# Patient Record
Sex: Male | Born: 1980 | Race: White | Hispanic: No | Marital: Single
Health system: Southern US, Community
[De-identification: ages and names within clinical notes are randomized; demographics above are authoritative.]

---

## 2005-02-13 ENCOUNTER — Encounter: Admission: RE | Admit: 2005-02-13 | Discharge: 2005-02-13 | Payer: Self-pay | Admitting: Neurosurgery

## 2005-02-26 ENCOUNTER — Encounter: Admission: RE | Admit: 2005-02-26 | Discharge: 2005-02-26 | Payer: Self-pay | Admitting: Neurosurgery

## 2005-09-18 ENCOUNTER — Emergency Department: Payer: Self-pay | Admitting: Emergency Medicine

## 2007-07-18 ENCOUNTER — Emergency Department: Payer: Self-pay | Admitting: Emergency Medicine

## 2007-09-07 ENCOUNTER — Emergency Department: Payer: Self-pay | Admitting: Emergency Medicine

## 2008-02-03 ENCOUNTER — Other Ambulatory Visit: Payer: Self-pay | Admitting: Emergency Medicine

## 2008-02-03 ENCOUNTER — Other Ambulatory Visit: Payer: Self-pay

## 2008-02-04 ENCOUNTER — Inpatient Hospital Stay (HOSPITAL_COMMUNITY): Admission: RE | Admit: 2008-02-04 | Discharge: 2008-02-09 | Payer: Self-pay | Admitting: Psychiatry

## 2008-02-10 ENCOUNTER — Ambulatory Visit: Payer: Self-pay | Admitting: Psychiatry

## 2008-08-28 IMAGING — CR DG FOOT COMPLETE 3+V*L*
1 series · 3 of 3 positions shown · non-contrast
Comparison: none

REASON FOR EXAM: Fall, swelling over dorsum
COMMENTS:

[Series 1: view not recorded · 0.17mm/px · 3 of 3 slices shown]
[im 1/3]
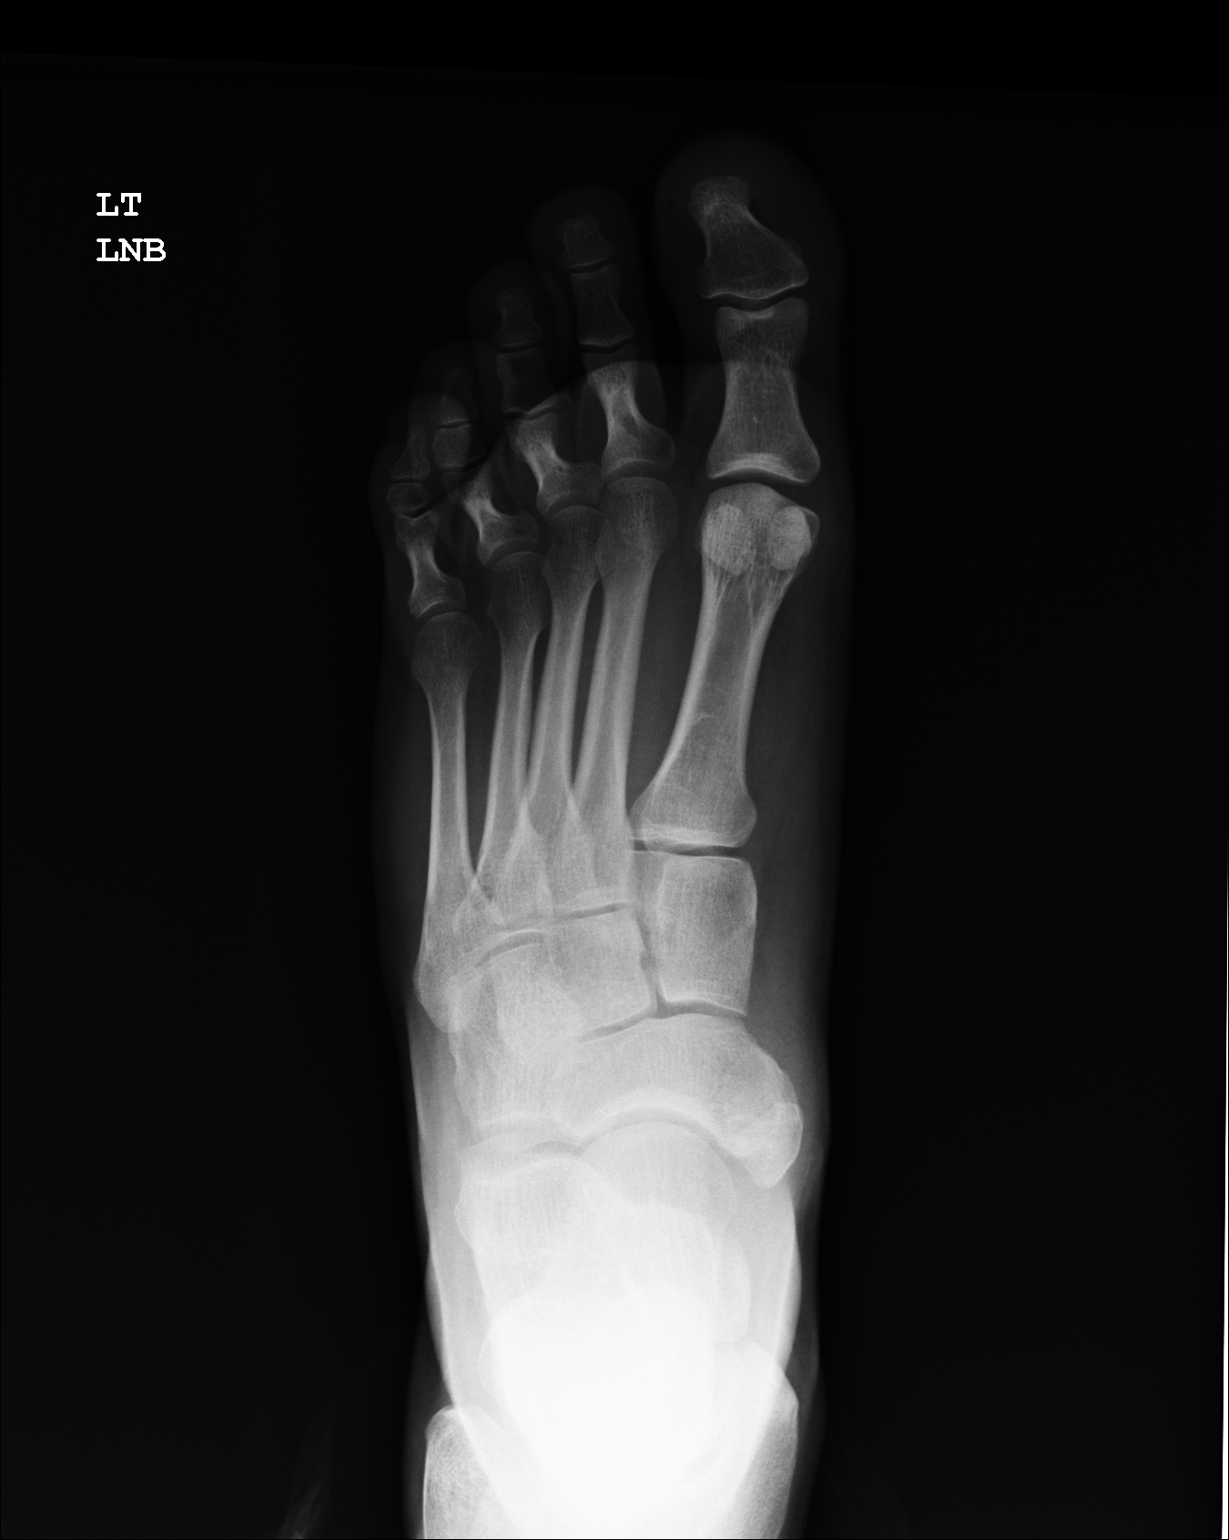
[im 2/3]
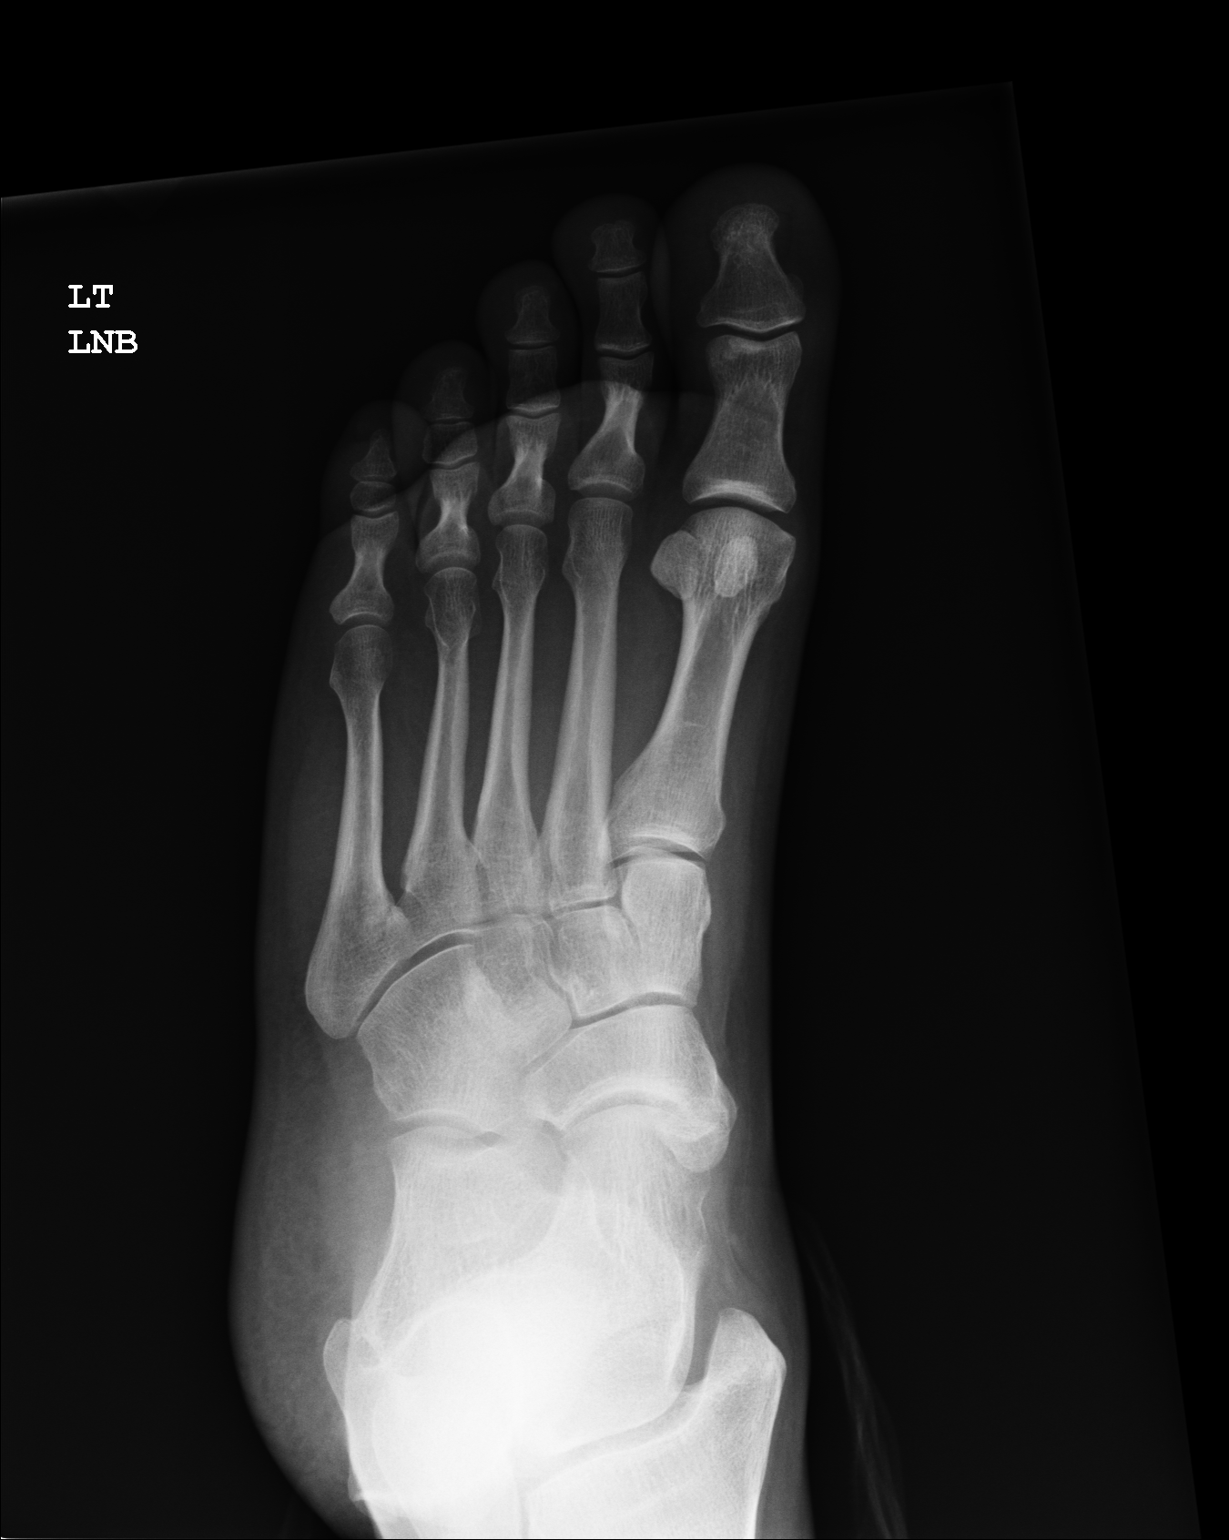
[im 3/3]
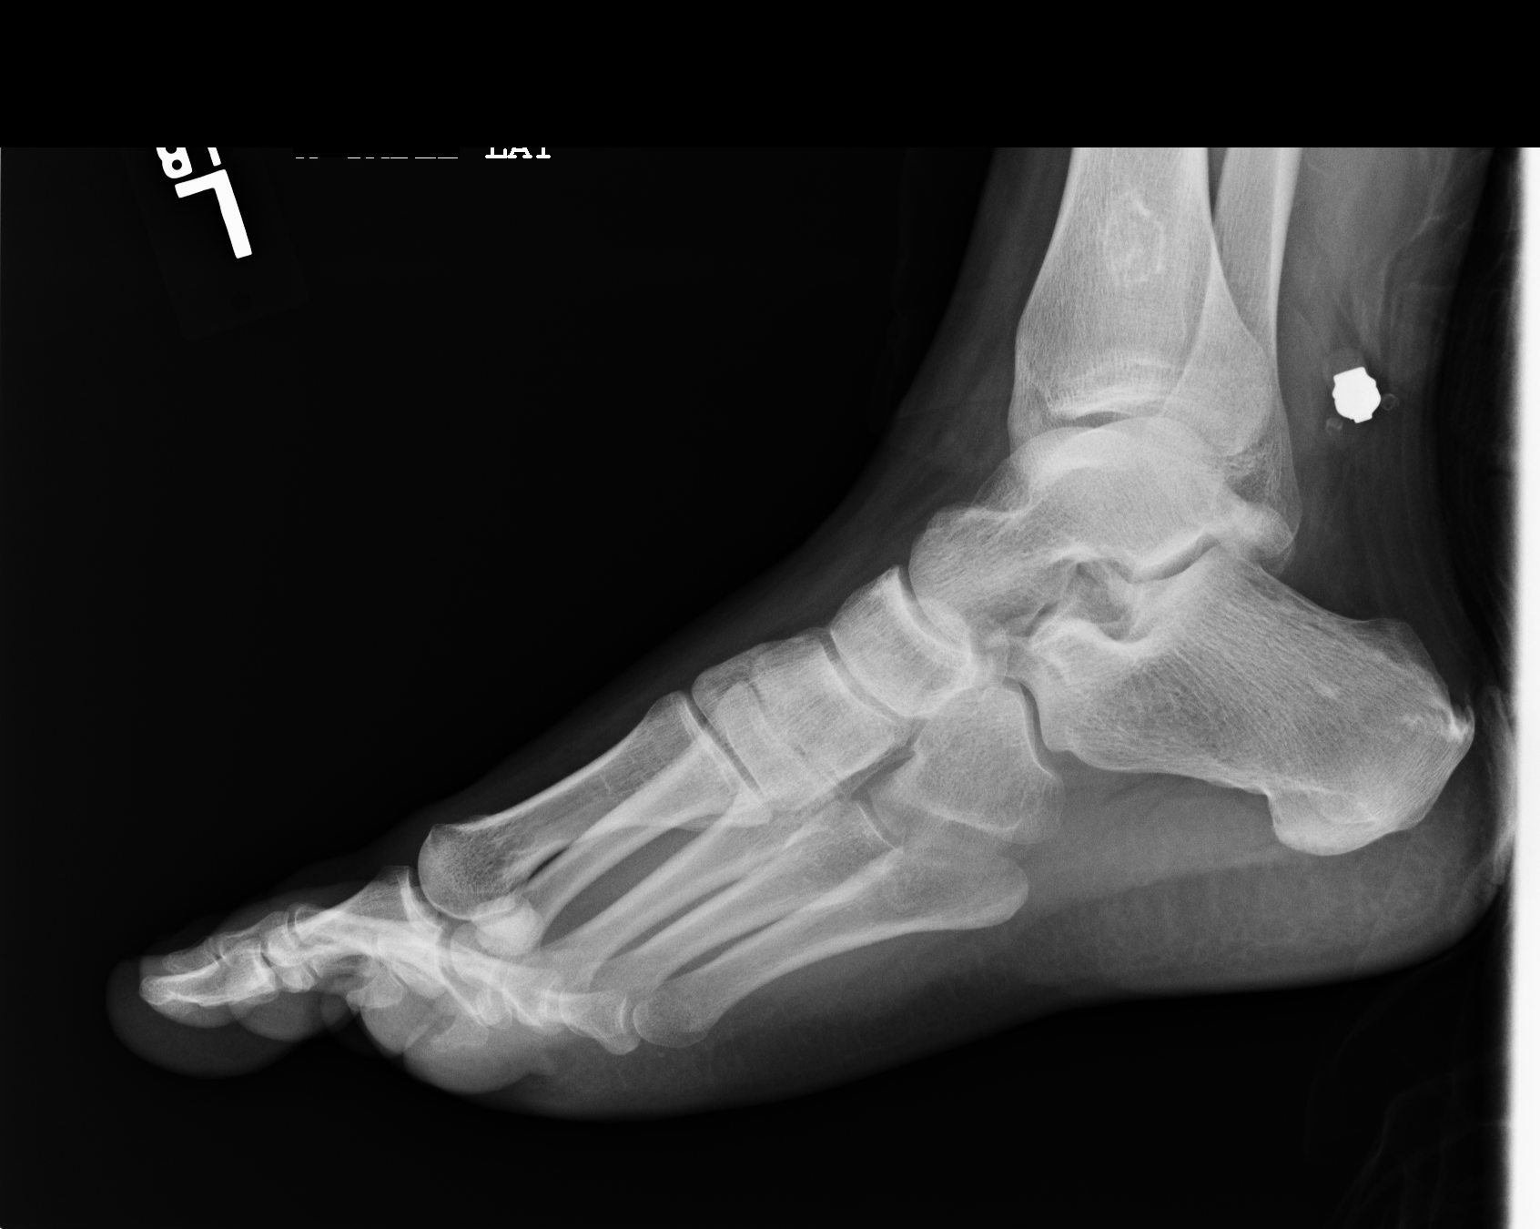

[3 of 3 positions shown; findings below may reference images not displayed]

PROCEDURE:     DXR - DXR FOOT LT COMP W/OBLIQUES  - September 07, 2007  [DATE]

RESULT:     The site of the patient's clinical symptoms is not known to me.
Reportedly, there is dorsal swelling.

The bones of the foot appear adequately mineralized. There is a small
Achilles region calcaneal spur. The metatarsals appear intact. No phalangeal
fracture is identified.
IMPRESSION: 1.  I do not see acute bony abnormality of the LEFT foot. Follow-up films
coned to any area of persistent symptoms would be of value.
2.  There is some sclerosis noted in the metaphysis of the distal tibia
which likely reflects a benign osseous/fibrous lesion. Correlation with any
symptoms here is needed.

## 2010-10-18 NOTE — Discharge Summary (Signed)
NAME:  DALYN, BECKER NO.:  [id number]   MEDICAL RECORD NO.:  [id number]          PATIENT TYPE:  IPS   LOCATION:  0500                          FACILITY:  [facility]   PHYSICIAN:  Geoffery Lyons, M.D.      DATE OF BIRTH:  04-13-1981   DATE OF ADMISSION:  02/04/2008  DATE OF DISCHARGE:  02/09/2008                               DISCHARGE SUMMARY   CHIEF COMPLAINT AND PRESENT ILLNESS:  This was the first admission to  [facility] for this 30 year old single white male who  claims having been diagnosed with schizoaffective disorder, Asperger's,  bipolar and communication disorder and has been on disability for all  these psychiatric disorders.  Admits to an extensive history of  substance abuse.  Used to use heroin, crack, pain pills.  He claims he  quit 4-5 months ago, but that since he has been increasing in his  alcohol use.  Drinks up to a fifth per day.  Says that pretty much he  has been drinking and wants to detox from alcohol and go to a  residential treatment center.   PAST PSYCHIATRIC HISTORY:  Has been in [facility], a program in [place].  Has also been in [facility].   MEDICATIONS:  Did the best on Depakote and Risperdal.   ALCOHOL AND DRUG HISTORY:  As already stated, persistent use of  substances, heroin, cocaine, pain pills, alcohol with increased use as  he discontinued the other drugs.   PHYSICAL EXAMINATION:  Exam failed to show any acute findings.   LABORATORY WORK:  SGOT 25, SGPT 50, total bilirubin 0.7.   MEDICATIONS ON ADMISSION:  Depakote 750 at bedtime and Risperdal 3 mg at  bedtime, last taken February 03, 2008.   MENTAL STATUS EXAMINATION:  An alert cooperative male somewhat sedated,  as he came to the hospital at 3:00 in the morning.  Reports no sleep,  feeling down, depressed, wanting to quit alcohol, feeling overwhelmed.  Denies any active suicidal or homicidal ideas.  There are no delusions.  No hallucinations.  Cognition  well-preserved.   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  AXIS II:  Asperger's by history.  AXIS III:  No diagnosis.  AXIS IV:  Moderate.  AXIS V:  Upon admission 35; [facility] in the last year 50.   COURSE IN THE HOSPITAL:  He was admitted.  We pursued detox with  [facility].  He was maintained on his medications, the Depakote and the  Risperdal.  He endorsed that he was wanting to go to a residential  treatment program.  On February 05, 2008, some cramping, joint aches,  sweats, tremors, cravings for alcohol.  Somewhat anxious, agitated.  We  addressed the symptoms as they developed.  Somewhat passive in his  approach to treatment.  On February 07, 2008, sleep was better.  Some of  the withdrawal symptoms were getting better, less shaky.  He seemed  committed to going to a residential treatment program.  On February 08, 2008, endorsed that he was ready to go  to rehabilitation.  He felt that  he was fully detoxed and now cannot deal with being confined in the  unit.  He stated that smoking was a major concern, because he wanted to  smoke.  [facility] were [facility]  [place] versus a shorter stay in [place] in [place].  He was really  wanting to leave the hospital, and on February 09, 2008, [facility] did not  have any bed.  He did not want to go to [facility] after he initially  expressed some desire to do so.  He was in full contact with reality.  There were no active suicidal or homicidal ideas, no hallucinations or  delusions.  No overt withdrawal.  He was wanting to be discharged and  pursue outpatient or even revisit the though of going to a residential  treatment program later.   DISCHARGE DIAGNOSES:  AXIS I:  Alcohol dependence, cocaine, opiate abuse  in early remission.  Mood disorder NOS.  AXIS II:  Asperger's by history.  AXIS III:  No diagnosis.  AXIS IV:  Moderate.   AXIS V:  Upon discharge 50.   DISCHARGE MEDICATIONS:  1. Trazodone 150 two at night for sleep.  2. Depakote ER 1000 mg at night.  3. Risperdal 3 mg at bedtime.   FOLLOWUP:  Follow up at [facility].      Geoffery Lyons, M.D.  Electronically Signed     IL/MEDQ  D:  02/15/2008  T:  02/17/2008  Job:  [id number]

## 2011-03-05 LAB — CBC
MCHC: 34.5
MCV: 98.8
RBC: 4.6

## 2011-03-05 LAB — DIFFERENTIAL
Eosinophils Absolute: 0.1
Eosinophils Relative: 1
Monocytes Absolute: 0.9
Monocytes Relative: 9
Neutrophils Relative %: 57

## 2011-03-05 LAB — ETHANOL: Alcohol, Ethyl (B): <5

## 2011-03-05 LAB — HEPATIC FUNCTION PANEL
Albumin: 3.9
Alkaline Phosphatase: 57
Bilirubin, Direct: 0.1
Indirect Bilirubin: 0.6

## 2011-03-05 LAB — POCT I-STAT, CHEM 8
Calcium, Ion: 1.17
Creatinine, Ser: 0.5
HCT: 47

## 2011-03-05 LAB — VALPROIC ACID LEVEL: Valproic Acid Lvl: 87.1

## 2020-08-10 ENCOUNTER — Encounter (HOSPITAL_COMMUNITY): Payer: Self-pay

## 2020-08-10 ENCOUNTER — Emergency Department (HOSPITAL_COMMUNITY)
Admission: EM | Admit: 2020-08-10 | Discharge: 2020-08-10 | Disposition: A | Discharge status: Home / Self Care | Payer: Medicare Other | Attending: Emergency Medicine | Admitting: Emergency Medicine

## 2020-08-10 DIAGNOSIS — S6991XA Unspecified injury of right wrist, hand and finger(s), initial encounter: Secondary | ICD-10-CM | POA: Insufficient documentation

## 2020-08-10 DIAGNOSIS — Z5321 Procedure and treatment not carried out due to patient leaving prior to being seen by health care provider: Secondary | ICD-10-CM | POA: Insufficient documentation

## 2020-08-10 DIAGNOSIS — W228XXA Striking against or struck by other objects, initial encounter: Secondary | ICD-10-CM | POA: Insufficient documentation

## 2020-08-10 NOTE — ED Notes (Signed)
Pt was able to get ring off finger while waiting, officers took pt out

## 2020-08-10 NOTE — ED Triage Notes (Signed)
Pt has a steel wheel bearing stuck on right rign finger, in custody. Pt seen at [facility] and they would not remove it
